# Patient Record
Sex: Female | Born: 2000 | Hispanic: Yes | Marital: Single | Smoking: Never smoker
Health system: Southern US, Community
[De-identification: ages and names within clinical notes are randomized; demographics above are authoritative.]

## PROBLEM LIST (undated history)

## (undated) DIAGNOSIS — R5600 Simple febrile convulsions: Secondary | ICD-10-CM

## (undated) DIAGNOSIS — R634 Abnormal weight loss: Secondary | ICD-10-CM

## (undated) HISTORY — DX: Abnormal weight loss: R63.4

## (undated) HISTORY — PX: NOSE SURGERY: SHX723

---
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
## 2019-11-29 ENCOUNTER — Ambulatory Visit: Payer: Self-pay | Attending: Internal Medicine

## 2019-11-29 DIAGNOSIS — Z23 Encounter for immunization: Secondary | ICD-10-CM

## 2019-11-29 NOTE — Progress Notes (Signed)
   Covid-19 Vaccination Clinic  Name:  Kellin Fifer    MRN: [id number] DOB: [birth year]  11/29/2019  Ms. Hilari Wethington was observed post Covid-19 immunization for 15 minutes without incident. She was provided with Vaccine Information Sheet and instruction to access the V-Safe system.   Ms. Dorreen Valiente was instructed to call 911 with any severe reactions post vaccine: Marland Kitchen Difficulty breathing  . Swelling of face and throat  . A fast heartbeat  . A bad rash all over body  . Dizziness and weakness   Immunizations Administered    Name Date Dose VIS Date Route   Pfizer COVID-19 Vaccine 11/29/2019  2:50 PM 0.3 mL 08/21/2019 Intramuscular   Manufacturer: [employer], [employer]   Lot: [id number]   NDC: [address]-6

## 2019-12-20 ENCOUNTER — Ambulatory Visit: Payer: Self-pay | Attending: Internal Medicine

## 2019-12-20 DIAGNOSIS — Z23 Encounter for immunization: Secondary | ICD-10-CM

## 2019-12-20 NOTE — Progress Notes (Signed)
   Covid-19 Vaccination Clinic  Name:  Whitney Escobar    MRN: [id number] DOB: 2001-04-05  12/20/2019  Ms. Whitney Escobar was observed post Covid-19 immunization for 15 minutes without incident. She was provided with Vaccine Information Sheet and instruction to access the V-Safe system.   Ms. Whitney Escobar was instructed to call 911 with any severe reactions post vaccine: Marland Kitchen Difficulty breathing  . Swelling of face and throat  . A fast heartbeat  . A bad rash all over body  . Dizziness and weakness   Immunizations Administered    Name Date Dose VIS Date Route   Pfizer COVID-19 Vaccine 12/20/2019  1:23 PM 0.3 mL 08/21/2019 Intramuscular   Manufacturer: [employer], [employer]   Lot: [id number]   NDC: [address]-7

## 2022-08-13 ENCOUNTER — Ambulatory Visit (LOCAL_COMMUNITY_HEALTH_CENTER): Payer: Medicaid Other

## 2022-08-13 DIAGNOSIS — Z719 Counseling, unspecified: Secondary | ICD-10-CM

## 2022-08-13 DIAGNOSIS — Z23 Encounter for immunization: Secondary | ICD-10-CM | POA: Diagnosis not present

## 2022-08-13 NOTE — Progress Notes (Signed)
  Are you feeling sick today? No   Have you ever received a dose of COVID-19 Vaccine? [employer], [place], [place], [place], Other) Yes  If yes, which vaccine and how many doses?   4 doses Pfizer   Did you bring the vaccination record card or other documentation?  Yes   Do you have a health condition or are undergoing treatment that makes you moderately or severely immunocompromised? This would include, but not be limited to: cancer, HIV, organ transplant, immunosuppressive therapy/high-dose corticosteroids, or moderate/severe primary immunodeficiency.  No  Have you received COVID-19 vaccine before or during hematopoietic cell transplant (HCT) or CAR-T-cell therapies? No  Have you ever had an allergic reaction to: (This would include a severe allergic reaction or a reaction that caused hives, swelling, or respiratory distress, including wheezing.) A component of a COVID-19 vaccine or a previous dose of COVID-19 vaccine? No   Have you ever had an allergic reaction to another vaccine (other thanCOVID-19 vaccine) or an injectable medication? (This would include a severe allergic reaction or a reaction that caused hives, swelling, or respiratory distress, including wheezing.)   No    Do you have a history of any of the following:  Myocarditis or Pericarditis No  Dermal fillers:  No  Multisystem Inflammatory Syndrome (MIS-C or MIS-A)? No  COVID-19 disease within the past 3 months? No  Vaccinated with monkeypox vaccine in the last 4 weeks? No  VIS provided.  IM in left deltoid.  Tolerated well. COVID card updated and [facility] updated and copy provided.  Waited 15 minutes.  Interpreter not needed.

## 2023-08-26 ENCOUNTER — Ambulatory Visit: Payer: Medicaid Other
